# Patient Record
Sex: Male | Born: 1970 | Race: Black or African American | Hispanic: No | Marital: Single | Smoking: Current every day smoker
Health system: Southern US, Community
[De-identification: ages and names within clinical notes are randomized; demographics above are authoritative.]

## PROBLEM LIST (undated history)

## (undated) DIAGNOSIS — I10 Essential (primary) hypertension: Secondary | ICD-10-CM

## (undated) HISTORY — PX: ABDOMINAL EXPLORATION SURGERY: SHX538

## (undated) HISTORY — PX: ABDOMINAL SURGERY: SHX537

---

## 1999-04-20 ENCOUNTER — Inpatient Hospital Stay (HOSPITAL_COMMUNITY): Admission: EM | Admit: 1999-04-20 | Discharge: 1999-04-22 | Payer: Self-pay | Admitting: Emergency Medicine

## 1999-04-20 ENCOUNTER — Encounter: Payer: Self-pay | Admitting: Emergency Medicine

## 1999-06-01 ENCOUNTER — Emergency Department (HOSPITAL_COMMUNITY): Admission: EM | Admit: 1999-06-01 | Discharge: 1999-06-01 | Payer: Self-pay | Admitting: Emergency Medicine

## 1999-06-02 ENCOUNTER — Encounter: Payer: Self-pay | Admitting: Emergency Medicine

## 2005-11-28 ENCOUNTER — Emergency Department: Payer: Self-pay | Admitting: Emergency Medicine

## 2006-03-09 ENCOUNTER — Emergency Department: Payer: Self-pay | Admitting: Emergency Medicine

## 2006-10-09 ENCOUNTER — Emergency Department (HOSPITAL_COMMUNITY): Admission: EM | Admit: 2006-10-09 | Discharge: 2006-10-09 | Payer: Self-pay | Admitting: Emergency Medicine

## 2007-08-27 ENCOUNTER — Emergency Department (HOSPITAL_COMMUNITY): Admission: EM | Admit: 2007-08-27 | Discharge: 2007-08-27 | Payer: Self-pay | Admitting: Emergency Medicine

## 2008-10-21 ENCOUNTER — Emergency Department (HOSPITAL_COMMUNITY): Admission: EM | Admit: 2008-10-21 | Discharge: 2008-10-21 | Payer: Self-pay | Admitting: Emergency Medicine

## 2011-08-12 LAB — CSF CELL COUNT WITH DIFFERENTIAL
Lymphs, CSF: 0 % — ABNORMAL LOW (ref 40–80)
Monocyte-Macrophage-Spinal Fluid: 0 % — ABNORMAL LOW (ref 15–45)
RBC Count, CSF: 0 /mm3
Tube #: 4
WBC, CSF: 2 /mm3 (ref 0–5)

## 2011-08-12 LAB — PROTEIN, CSF: Total  Protein, CSF: 36 mg/dL (ref 15–45)

## 2011-08-12 LAB — CSF CULTURE W GRAM STAIN: Culture: NO GROWTH

## 2011-08-12 LAB — GLUCOSE, CSF: Glucose, CSF: 70 mg/dL (ref 43–76)

## 2011-08-17 LAB — URINE CULTURE
Colony Count: NO GROWTH
Culture: NO GROWTH

## 2011-08-17 LAB — URINALYSIS, ROUTINE W REFLEX MICROSCOPIC
Ketones, ur: NEGATIVE
Leukocytes, UA: NEGATIVE
Protein, ur: NEGATIVE
Urobilinogen, UA: 0.2

## 2011-08-17 LAB — URINE MICROSCOPIC-ADD ON

## 2011-11-19 ENCOUNTER — Encounter (HOSPITAL_COMMUNITY): Payer: Self-pay | Admitting: Urgent Care

## 2011-11-19 ENCOUNTER — Emergency Department (HOSPITAL_COMMUNITY)
Admission: EM | Admit: 2011-11-19 | Discharge: 2011-11-19 | Disposition: A | Discharge status: Home / Self Care | Payer: Self-pay | Source: Home / Self Care | Attending: Family Medicine | Admitting: Family Medicine

## 2011-11-19 DIAGNOSIS — J1189 Influenza due to unidentified influenza virus with other manifestations: Secondary | ICD-10-CM

## 2011-11-19 DIAGNOSIS — J112 Influenza due to unidentified influenza virus with gastrointestinal manifestations: Secondary | ICD-10-CM

## 2011-11-19 MED ORDER — ONDANSETRON HCL 4 MG/2ML IJ SOLN
4.0000 mg | Freq: Once | INTRAMUSCULAR | Status: AC
  Administered 2011-11-19: 4 mg via INTRAVENOUS

## 2011-11-19 MED ORDER — ONDANSETRON HCL 4 MG PO TABS: 4.0000 mg | ORAL_TABLET | Freq: Four times a day (QID) | ORAL | Status: AC

## 2011-11-19 MED ORDER — ONDANSETRON HCL 4 MG/2ML IJ SOLN
INTRAMUSCULAR | Status: AC
Start: 2011-11-19 — End: 2011-11-19
  Filled 2011-11-19: qty 2

## 2011-11-19 MED ORDER — SODIUM CHLORIDE 0.9 % IV BOLUS (SEPSIS)
1000.0000 mL | Freq: Once | INTRAVENOUS | Status: AC
  Administered 2011-11-19: 1000 mL via INTRAVENOUS
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
## 2011-11-19 NOTE — ED Notes (Signed)
Vomiting today, onset yesterday evening.  Minimal diarrhea.  Epigastric pain is aching.

## 2011-11-19 NOTE — ED Notes (Signed)
Dr Artis Flock notified of vital signs and recheck of vital signs post ivf infusion.

## 2011-11-19 NOTE — ED Notes (Signed)
Patient reports feeling a little better.

## 2011-11-19 NOTE — ED Notes (Signed)
Provided, pillow, blankets, repositioned exam table and dimmed lights.

## 2011-11-19 NOTE — ED Notes (Signed)
500 cc of fluid has infused

## 2011-11-26 ENCOUNTER — Encounter (HOSPITAL_COMMUNITY): Payer: Self-pay | Admitting: Emergency Medicine

## 2013-05-31 ENCOUNTER — Emergency Department (HOSPITAL_COMMUNITY): Payer: No Typology Code available for payment source

## 2013-05-31 ENCOUNTER — Encounter (HOSPITAL_COMMUNITY): Payer: Self-pay | Admitting: Adult Health

## 2013-05-31 DIAGNOSIS — F17200 Nicotine dependence, unspecified, uncomplicated: Secondary | ICD-10-CM | POA: Insufficient documentation

## 2013-05-31 DIAGNOSIS — S0993XA Unspecified injury of face, initial encounter: Secondary | ICD-10-CM | POA: Insufficient documentation

## 2013-05-31 DIAGNOSIS — S4980XA Other specified injuries of shoulder and upper arm, unspecified arm, initial encounter: Secondary | ICD-10-CM | POA: Insufficient documentation

## 2013-05-31 DIAGNOSIS — S46909A Unspecified injury of unspecified muscle, fascia and tendon at shoulder and upper arm level, unspecified arm, initial encounter: Secondary | ICD-10-CM | POA: Insufficient documentation

## 2013-05-31 DIAGNOSIS — Y9389 Activity, other specified: Secondary | ICD-10-CM | POA: Insufficient documentation

## 2013-05-31 DIAGNOSIS — Y92410 Unspecified street and highway as the place of occurrence of the external cause: Secondary | ICD-10-CM | POA: Insufficient documentation

## 2013-05-31 DIAGNOSIS — J45909 Unspecified asthma, uncomplicated: Secondary | ICD-10-CM | POA: Insufficient documentation

## 2013-05-31 DIAGNOSIS — T07XXXA Unspecified multiple injuries, initial encounter: Secondary | ICD-10-CM | POA: Insufficient documentation
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
## 2013-06-01 ENCOUNTER — Emergency Department (HOSPITAL_COMMUNITY)
Admission: EM | Admit: 2013-06-01 | Discharge: 2013-06-01 | Disposition: A | Discharge status: Home / Self Care | Payer: No Typology Code available for payment source | Attending: Emergency Medicine | Admitting: Emergency Medicine

## 2013-06-01 ENCOUNTER — Emergency Department (HOSPITAL_COMMUNITY): Payer: No Typology Code available for payment source

## 2013-06-01 DIAGNOSIS — T07XXXA Unspecified multiple injuries, initial encounter: Secondary | ICD-10-CM

## 2013-06-01 MED ORDER — HYDROMORPHONE HCL PF 1 MG/ML IJ SOLN
1.0000 mg | Freq: Once | INTRAMUSCULAR | Status: AC
  Administered 2013-06-01: 1 mg via INTRAMUSCULAR
  Filled 2013-06-01: qty 1

## 2013-06-01 MED ORDER — IBUPROFEN 800 MG PO TABS
800.0000 mg | ORAL_TABLET | Freq: Once | ORAL | Status: AC
  Administered 2013-06-01: 800 mg via ORAL
  Filled 2013-06-01: qty 1

## 2013-06-01 MED ORDER — ONDANSETRON 4 MG PO TBDP
4.0000 mg | ORAL_TABLET | Freq: Once | ORAL | Status: AC
  Administered 2013-06-01: 4 mg via ORAL
  Filled 2013-06-01: qty 1

## 2013-06-01 MED ORDER — IBUPROFEN 800 MG PO TABS: 800.0000 mg | ORAL_TABLET | Freq: Three times a day (TID) | ORAL | Status: DC

## 2013-06-01 MED ORDER — HYDROCODONE-ACETAMINOPHEN 5-325 MG PO TABS: 2.0000 | ORAL_TABLET | ORAL | Status: DC | PRN
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
## 2014-04-15 ENCOUNTER — Encounter (HOSPITAL_COMMUNITY): Payer: Self-pay | Admitting: Emergency Medicine

## 2014-04-15 ENCOUNTER — Emergency Department (HOSPITAL_COMMUNITY): Payer: No Typology Code available for payment source

## 2014-04-15 ENCOUNTER — Emergency Department (HOSPITAL_COMMUNITY)
Admission: EM | Admit: 2014-04-15 | Discharge: 2014-04-15 | Disposition: A | Discharge status: Home / Self Care | Payer: Self-pay | Attending: Emergency Medicine | Admitting: Emergency Medicine

## 2014-04-15 DIAGNOSIS — Z791 Long term (current) use of non-steroidal anti-inflammatories (NSAID): Secondary | ICD-10-CM | POA: Insufficient documentation

## 2014-04-15 DIAGNOSIS — I498 Other specified cardiac arrhythmias: Secondary | ICD-10-CM | POA: Insufficient documentation

## 2014-04-15 DIAGNOSIS — F17200 Nicotine dependence, unspecified, uncomplicated: Secondary | ICD-10-CM | POA: Insufficient documentation

## 2014-04-15 DIAGNOSIS — IMO0002 Reserved for concepts with insufficient information to code with codable children: Secondary | ICD-10-CM | POA: Insufficient documentation

## 2014-04-15 DIAGNOSIS — Y92410 Unspecified street and highway as the place of occurrence of the external cause: Secondary | ICD-10-CM | POA: Insufficient documentation

## 2014-04-15 DIAGNOSIS — S0993XA Unspecified injury of face, initial encounter: Secondary | ICD-10-CM | POA: Insufficient documentation

## 2014-04-15 DIAGNOSIS — S1981XA Other specified injuries of larynx, initial encounter: Secondary | ICD-10-CM

## 2014-04-15 DIAGNOSIS — S199XXA Unspecified injury of neck, initial encounter: Principal | ICD-10-CM

## 2014-04-15 DIAGNOSIS — J45909 Unspecified asthma, uncomplicated: Secondary | ICD-10-CM | POA: Insufficient documentation

## 2014-04-15 DIAGNOSIS — Y9389 Activity, other specified: Secondary | ICD-10-CM | POA: Insufficient documentation

## 2014-04-15 DIAGNOSIS — Z23 Encounter for immunization: Secondary | ICD-10-CM | POA: Insufficient documentation

## 2014-04-15 LAB — BASIC METABOLIC PANEL
BUN: 7 mg/dL (ref 6–23)
CALCIUM: 9.6 mg/dL (ref 8.4–10.5)
CO2: 24 meq/L (ref 19–32)
CREATININE: 1.04 mg/dL (ref 0.50–1.35)
Chloride: 107 mEq/L (ref 96–112)
GFR calc Af Amer: >90 mL/min (ref >90)
GFR calc non Af Amer: 85 mL/min — ABNORMAL LOW (ref >90)
GLUCOSE: 104 mg/dL — AB (ref 70–99)
Potassium: 3.6 mEq/L — ABNORMAL LOW (ref 3.7–5.3)
Sodium: 143 mEq/L (ref 137–147)

## 2014-04-15 LAB — CBC
HCT: 39.2 % (ref 39.0–52.0)
HEMOGLOBIN: 13.7 g/dL (ref 13.0–17.0)
MCH: 32.3 pg (ref 26.0–34.0)
MCHC: 34.9 g/dL (ref 30.0–36.0)
MCV: 92.5 fL (ref 78.0–100.0)
PLATELETS: 244 10*3/uL (ref 150–400)
RBC: 4.24 MIL/uL (ref 4.22–5.81)
RDW: 13.3 % (ref 11.5–15.5)
WBC: 11.4 10*3/uL — ABNORMAL HIGH (ref 4.0–10.5)

## 2014-04-15 LAB — I-STAT CHEM 8, ED
BUN: 5 mg/dL — AB (ref 6–23)
CREATININE: 1.1 mg/dL (ref 0.50–1.35)
Calcium, Ion: 1.27 mmol/L — ABNORMAL HIGH (ref 1.12–1.23)
Chloride: 104 mEq/L (ref 96–112)
Glucose, Bld: 106 mg/dL — ABNORMAL HIGH (ref 70–99)
HCT: 46 % (ref 39.0–52.0)
Hemoglobin: 15.6 g/dL (ref 13.0–17.0)
Potassium: 3.5 mEq/L — ABNORMAL LOW (ref 3.7–5.3)
SODIUM: 144 meq/L (ref 137–147)
TCO2: 23 mmol/L (ref 0–100)

## 2014-04-15 MED ORDER — MORPHINE SULFATE 4 MG/ML IJ SOLN
4.0000 mg | Freq: Once | INTRAMUSCULAR | Status: AC
  Administered 2014-04-15: 4 mg via INTRAVENOUS
  Filled 2014-04-15: qty 1

## 2014-04-15 MED ORDER — SODIUM CHLORIDE 0.9 % IV BOLUS (SEPSIS)
1000.0000 mL | Freq: Once | INTRAVENOUS | Status: AC
  Administered 2014-04-15: 1000 mL via INTRAVENOUS

## 2014-04-15 MED ORDER — IOHEXOL 350 MG/ML SOLN
50.0000 mL | Freq: Once | INTRAVENOUS | Status: AC | PRN
  Administered 2014-04-15: 50 mL via INTRAVENOUS

## 2014-04-15 MED ORDER — IBUPROFEN 800 MG PO TABS: 800.0000 mg | ORAL_TABLET | Freq: Three times a day (TID) | ORAL | Status: DC

## 2014-04-15 MED ORDER — TETANUS-DIPHTH-ACELL PERTUSSIS 5-2.5-18.5 LF-MCG/0.5 IM SUSP
0.5000 mL | Freq: Once | INTRAMUSCULAR | Status: AC
  Administered 2014-04-15: 0.5 mL via INTRAMUSCULAR
  Filled 2014-04-15: qty 0.5

## 2014-04-15 MED ORDER — OXYCODONE-ACETAMINOPHEN 5-325 MG PO TABS: 1.0000 | ORAL_TABLET | ORAL | Status: DC | PRN
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
## 2014-04-15 NOTE — Discharge Instructions (Signed)
Call for a follow up appointment with a Family or Primary Care Provider.  Call Dr. Jearld Fenton or another otolaryngologist (ear nose throat specialist) for further evaluation of your neck injury. Return to the Emergency Department if symptoms worsen.   Take medication as prescribed.  Ice your neck 3-4 times a day as needed. Do not work or operate heavy machinery, drive while taking the Percocet.
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
## 2014-04-15 NOTE — ED Notes (Signed)
CT called and advised of IV placement
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
## 2015-03-19 ENCOUNTER — Emergency Department (HOSPITAL_COMMUNITY): Payer: Self-pay

## 2015-03-19 ENCOUNTER — Emergency Department (HOSPITAL_COMMUNITY)
Admission: EM | Admit: 2015-03-19 | Discharge: 2015-03-19 | Disposition: A | Discharge status: Home / Self Care | Payer: Self-pay | Attending: Emergency Medicine | Admitting: Emergency Medicine

## 2015-03-19 DIAGNOSIS — J45901 Unspecified asthma with (acute) exacerbation: Secondary | ICD-10-CM | POA: Insufficient documentation

## 2015-03-19 DIAGNOSIS — Z720 Tobacco use: Secondary | ICD-10-CM | POA: Insufficient documentation

## 2015-03-19 DIAGNOSIS — R112 Nausea with vomiting, unspecified: Secondary | ICD-10-CM | POA: Insufficient documentation

## 2015-03-19 DIAGNOSIS — R42 Dizziness and giddiness: Secondary | ICD-10-CM | POA: Insufficient documentation

## 2015-03-19 DIAGNOSIS — R109 Unspecified abdominal pain: Secondary | ICD-10-CM | POA: Insufficient documentation

## 2015-03-19 DIAGNOSIS — R079 Chest pain, unspecified: Secondary | ICD-10-CM | POA: Insufficient documentation

## 2015-03-19 LAB — I-STAT TROPONIN, ED: Troponin i, poc: 0.01 ng/mL (ref 0.00–0.08)

## 2015-03-19 LAB — CBC
HCT: 43.9 % (ref 39.0–52.0)
Hemoglobin: 15.4 g/dL (ref 13.0–17.0)
MCH: 31.8 pg (ref 26.0–34.0)
MCHC: 35.1 g/dL (ref 30.0–36.0)
MCV: 90.5 fL (ref 78.0–100.0)
Platelets: 294 10*3/uL (ref 150–400)
RBC: 4.85 MIL/uL (ref 4.22–5.81)
RDW: 13.9 % (ref 11.5–15.5)
WBC: 14.3 10*3/uL — ABNORMAL HIGH (ref 4.0–10.5)

## 2015-03-19 LAB — BASIC METABOLIC PANEL
Anion gap: 15 (ref 5–15)
BUN: 6 mg/dL (ref 6–20)
CO2: 24 mmol/L (ref 22–32)
Calcium: 9.7 mg/dL (ref 8.9–10.3)
Chloride: 100 mmol/L — ABNORMAL LOW (ref 101–111)
Creatinine, Ser: 1.05 mg/dL (ref 0.61–1.24)
GFR calc Af Amer: >60 mL/min (ref >60)
GFR calc non Af Amer: >60 mL/min (ref >60)
Glucose, Bld: 154 mg/dL — ABNORMAL HIGH (ref 65–99)
Potassium: 3.6 mmol/L (ref 3.5–5.1)
Sodium: 139 mmol/L (ref 135–145)

## 2015-03-19 LAB — BRAIN NATRIURETIC PEPTIDE: B Natriuretic Peptide: 191.4 pg/mL — ABNORMAL HIGH (ref 0.0–100.0)

## 2015-03-19 MED ORDER — ONDANSETRON HCL 4 MG PO TABS: 4.0000 mg | ORAL_TABLET | Freq: Four times a day (QID) | ORAL | Status: DC

## 2015-03-19 MED ORDER — SODIUM CHLORIDE 0.9 % IV BOLUS (SEPSIS)
1000.0000 mL | Freq: Once | INTRAVENOUS | Status: AC
  Administered 2015-03-19: 1000 mL via INTRAVENOUS

## 2015-03-19 MED ORDER — HYDROMORPHONE HCL 1 MG/ML IJ SOLN
1.0000 mg | Freq: Once | INTRAMUSCULAR | Status: AC
  Administered 2015-03-19: 1 mg via INTRAVENOUS
  Filled 2015-03-19: qty 1

## 2015-03-19 MED ORDER — AMLODIPINE BESYLATE 10 MG PO TABS: 10.0000 mg | ORAL_TABLET | Freq: Every day | ORAL | Status: DC

## 2015-03-19 MED ORDER — ONDANSETRON HCL 4 MG/2ML IJ SOLN
4.0000 mg | Freq: Once | INTRAMUSCULAR | Status: AC
Start: 2015-03-19 — End: 2015-03-19
  Administered 2015-03-19: 4 mg via INTRAVENOUS
  Filled 2015-03-19: qty 2

## 2015-03-19 NOTE — ED Notes (Signed)
Pt in xray, spoke with radiology. They will bring pt to E38 when finished with xray
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
## 2015-03-19 NOTE — ED Notes (Signed)
Pt's HR in the 40s. Pt has hx of bradycardia and has been in the 40s in the last 3 visits dating back to 2014

## 2015-03-19 NOTE — ED Notes (Signed)
Pt reports n/v and left sided chest pain for two days. Pt reports shortness of breath, dizziness, lightheadedness, n/v and weakness with the chest pain.

## 2015-03-19 NOTE — Discharge Instructions (Signed)
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
## 2016-06-27 ENCOUNTER — Encounter (HOSPITAL_COMMUNITY): Payer: Self-pay | Admitting: Emergency Medicine

## 2016-06-27 ENCOUNTER — Emergency Department (HOSPITAL_COMMUNITY): Payer: Self-pay

## 2016-06-27 ENCOUNTER — Emergency Department (HOSPITAL_COMMUNITY)
Admission: EM | Admit: 2016-06-27 | Discharge: 2016-06-27 | Disposition: A | Discharge status: Home / Self Care | Payer: Self-pay | Attending: Emergency Medicine | Admitting: Emergency Medicine

## 2016-06-27 DIAGNOSIS — R112 Nausea with vomiting, unspecified: Secondary | ICD-10-CM | POA: Insufficient documentation

## 2016-06-27 DIAGNOSIS — F17210 Nicotine dependence, cigarettes, uncomplicated: Secondary | ICD-10-CM | POA: Insufficient documentation

## 2016-06-27 DIAGNOSIS — Z791 Long term (current) use of non-steroidal anti-inflammatories (NSAID): Secondary | ICD-10-CM | POA: Insufficient documentation

## 2016-06-27 DIAGNOSIS — R1084 Generalized abdominal pain: Secondary | ICD-10-CM | POA: Insufficient documentation

## 2016-06-27 DIAGNOSIS — J45909 Unspecified asthma, uncomplicated: Secondary | ICD-10-CM | POA: Insufficient documentation

## 2016-06-27 LAB — CBC
HEMATOCRIT: 42.1 % (ref 39.0–52.0)
Hemoglobin: 14.7 g/dL (ref 13.0–17.0)
MCH: 31.7 pg (ref 26.0–34.0)
MCHC: 34.9 g/dL (ref 30.0–36.0)
MCV: 90.7 fL (ref 78.0–100.0)
Platelets: 277 10*3/uL (ref 150–400)
RBC: 4.64 MIL/uL (ref 4.22–5.81)
RDW: 14.4 % (ref 11.5–15.5)
WBC: 14.1 10*3/uL — AB (ref 4.0–10.5)

## 2016-06-27 LAB — COMPREHENSIVE METABOLIC PANEL
ALBUMIN: 4.5 g/dL (ref 3.5–5.0)
ALT: 20 U/L (ref 17–63)
AST: 22 U/L (ref 15–41)
Alkaline Phosphatase: 75 U/L (ref 38–126)
Anion gap: 9 (ref 5–15)
BUN: 8 mg/dL (ref 6–20)
CO2: 25 mmol/L (ref 22–32)
Calcium: 9.6 mg/dL (ref 8.9–10.3)
Chloride: 106 mmol/L (ref 101–111)
Creatinine, Ser: 1 mg/dL (ref 0.61–1.24)
GFR calc Af Amer: >60 mL/min (ref >60)
Glucose, Bld: 152 mg/dL — ABNORMAL HIGH (ref 65–99)
POTASSIUM: 3.5 mmol/L (ref 3.5–5.1)
Sodium: 140 mmol/L (ref 135–145)
Total Bilirubin: 0.5 mg/dL (ref 0.3–1.2)
Total Protein: 8.4 g/dL — ABNORMAL HIGH (ref 6.5–8.1)

## 2016-06-27 LAB — URINALYSIS, ROUTINE W REFLEX MICROSCOPIC
Bilirubin Urine: NEGATIVE
GLUCOSE, UA: NEGATIVE mg/dL
KETONES UR: 15 mg/dL — AB
LEUKOCYTES UA: NEGATIVE
Nitrite: NEGATIVE
Protein, ur: 30 mg/dL — AB
Specific Gravity, Urine: 1.025 (ref 1.005–1.030)
pH: 7.5 (ref 5.0–8.0)

## 2016-06-27 LAB — URINE MICROSCOPIC-ADD ON
BACTERIA UA: NONE SEEN
Squamous Epithelial / LPF: NONE SEEN
WBC, UA: NONE SEEN WBC/hpf (ref 0–5)

## 2016-06-27 LAB — LIPASE, BLOOD: LIPASE: 19 U/L (ref 11–51)

## 2016-06-27 MED ORDER — SODIUM CHLORIDE 0.9 % IV BOLUS (SEPSIS)
1000.0000 mL | Freq: Once | INTRAVENOUS | Status: AC
  Administered 2016-06-27: 1000 mL via INTRAVENOUS

## 2016-06-27 MED ORDER — ONDANSETRON 8 MG PO TBDP: 8.0000 mg | ORAL_TABLET | Freq: Three times a day (TID) | ORAL | 0 refills | Status: DC | PRN

## 2016-06-27 MED ORDER — FENTANYL CITRATE (PF) 100 MCG/2ML IJ SOLN
100.0000 ug | Freq: Once | INTRAMUSCULAR | Status: AC
  Administered 2016-06-27: 100 ug via INTRAVENOUS
  Filled 2016-06-27: qty 2

## 2016-06-27 MED ORDER — ONDANSETRON HCL 4 MG/2ML IJ SOLN
4.0000 mg | Freq: Once | INTRAMUSCULAR | Status: AC
  Administered 2016-06-27: 4 mg via INTRAVENOUS
  Filled 2016-06-27: qty 2

## 2016-06-27 NOTE — ED Notes (Signed)
Pt stated that he would try to urinate. Will check again in 10 minutes.
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
## 2016-06-27 NOTE — ED Triage Notes (Signed)
Patient presents for generalized abdominal pain, N/V x2-3 days. Reports 10+ episodes of emesis in last 24 hours. Denies fever, diarrhea or urinary symptoms. A&O x4.

## 2016-06-27 NOTE — Progress Notes (Signed)
EDCM spoke to patient at bedside. Patient confirms he does not have a pcp or insurance living in [place].  EDCM provided patient with contact information to [facility], informed patient of services there and walk in times.  EDCM also provided patient with list of pcps who accept self pay patients, list of discount pharmacies and websites needymeds.org and GoodRX.com for medication assistance, phone number to inquire about the orange card, phone number to inquire about Medicaid, phone number to inquire about the Affordable Care Act, financial resources in the community such as local churches, [employer], urban ministries, and dental assistance for uninsured patients.  Patient thankful for resources.  No further EDCM needs at this time. 

## 2016-06-27 NOTE — ED Notes (Signed)
Pt in xray

## 2016-06-28 ENCOUNTER — Telehealth: Payer: Self-pay | Admitting: *Deleted

## 2016-09-19 ENCOUNTER — Emergency Department (HOSPITAL_COMMUNITY): Payer: No Typology Code available for payment source

## 2016-09-19 ENCOUNTER — Emergency Department (HOSPITAL_COMMUNITY)
Admission: EM | Admit: 2016-09-19 | Discharge: 2016-09-19 | Disposition: A | Discharge status: Home / Self Care | Payer: No Typology Code available for payment source | Attending: Emergency Medicine | Admitting: Emergency Medicine

## 2016-09-19 ENCOUNTER — Encounter (HOSPITAL_COMMUNITY): Payer: Self-pay | Admitting: *Deleted

## 2016-09-19 DIAGNOSIS — S3992XA Unspecified injury of lower back, initial encounter: Secondary | ICD-10-CM | POA: Diagnosis present

## 2016-09-19 DIAGNOSIS — Y999 Unspecified external cause status: Secondary | ICD-10-CM | POA: Diagnosis not present

## 2016-09-19 DIAGNOSIS — S9031XA Contusion of right foot, initial encounter: Secondary | ICD-10-CM | POA: Insufficient documentation

## 2016-09-19 DIAGNOSIS — S8011XA Contusion of right lower leg, initial encounter: Secondary | ICD-10-CM | POA: Diagnosis not present

## 2016-09-19 DIAGNOSIS — S8001XA Contusion of right knee, initial encounter: Secondary | ICD-10-CM

## 2016-09-19 DIAGNOSIS — S39012A Strain of muscle, fascia and tendon of lower back, initial encounter: Secondary | ICD-10-CM | POA: Insufficient documentation

## 2016-09-19 DIAGNOSIS — S9001XA Contusion of right ankle, initial encounter: Secondary | ICD-10-CM | POA: Diagnosis not present

## 2016-09-19 DIAGNOSIS — J45909 Unspecified asthma, uncomplicated: Secondary | ICD-10-CM | POA: Insufficient documentation

## 2016-09-19 DIAGNOSIS — Y939 Activity, unspecified: Secondary | ICD-10-CM | POA: Diagnosis not present

## 2016-09-19 DIAGNOSIS — S80211A Abrasion, right knee, initial encounter: Secondary | ICD-10-CM | POA: Insufficient documentation

## 2016-09-19 DIAGNOSIS — F17210 Nicotine dependence, cigarettes, uncomplicated: Secondary | ICD-10-CM | POA: Diagnosis not present

## 2016-09-19 DIAGNOSIS — S60212A Contusion of left wrist, initial encounter: Secondary | ICD-10-CM | POA: Diagnosis not present

## 2016-09-19 DIAGNOSIS — Y92410 Unspecified street and highway as the place of occurrence of the external cause: Secondary | ICD-10-CM | POA: Diagnosis not present

## 2016-09-19 MED ORDER — CYCLOBENZAPRINE HCL 10 MG PO TABS: 10.0000 mg | ORAL_TABLET | Freq: Two times a day (BID) | ORAL | 0 refills | Status: DC | PRN

## 2016-09-19 MED ORDER — HYDROCODONE-ACETAMINOPHEN 5-325 MG PO TABS: 1.0000 | ORAL_TABLET | ORAL | 0 refills | Status: DC | PRN

## 2016-09-19 MED ORDER — CYCLOBENZAPRINE HCL 10 MG PO TABS
10.0000 mg | ORAL_TABLET | Freq: Once | ORAL | Status: AC
  Administered 2016-09-19: 10 mg via ORAL
  Filled 2016-09-19: qty 1

## 2016-09-19 MED ORDER — HYDROCODONE-ACETAMINOPHEN 5-325 MG PO TABS
1.0000 | ORAL_TABLET | Freq: Once | ORAL | Status: AC
  Administered 2016-09-19: 1 via ORAL
  Filled 2016-09-19: qty 1

## 2016-09-19 NOTE — ED Notes (Signed)
Pt is in stable condition upon d/c and ambulates from ED. 
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
## 2017-01-23 ENCOUNTER — Encounter (HOSPITAL_COMMUNITY): Payer: Self-pay | Admitting: Emergency Medicine

## 2017-01-23 DIAGNOSIS — F17210 Nicotine dependence, cigarettes, uncomplicated: Secondary | ICD-10-CM | POA: Insufficient documentation

## 2017-01-23 DIAGNOSIS — R1110 Vomiting, unspecified: Secondary | ICD-10-CM | POA: Insufficient documentation

## 2017-01-23 DIAGNOSIS — Z5321 Procedure and treatment not carried out due to patient leaving prior to being seen by health care provider: Secondary | ICD-10-CM | POA: Insufficient documentation

## 2017-01-23 DIAGNOSIS — Z79899 Other long term (current) drug therapy: Secondary | ICD-10-CM | POA: Insufficient documentation

## 2017-01-23 LAB — CBC
HCT: 42.2 % (ref 39.0–52.0)
HEMOGLOBIN: 14.6 g/dL (ref 13.0–17.0)
MCH: 31.3 pg (ref 26.0–34.0)
MCHC: 34.6 g/dL (ref 30.0–36.0)
MCV: 90.6 fL (ref 78.0–100.0)
PLATELETS: 286 10*3/uL (ref 150–400)
RBC: 4.66 MIL/uL (ref 4.22–5.81)
RDW: 14.6 % (ref 11.5–15.5)
WBC: 14.7 10*3/uL — ABNORMAL HIGH (ref 4.0–10.5)

## 2017-01-23 LAB — COMPREHENSIVE METABOLIC PANEL
ALBUMIN: 4.2 g/dL (ref 3.5–5.0)
ALK PHOS: 84 U/L (ref 38–126)
ALT: 21 U/L (ref 17–63)
ANION GAP: 10 (ref 5–15)
AST: 22 U/L (ref 15–41)
BUN: 6 mg/dL (ref 6–20)
CO2: 26 mmol/L (ref 22–32)
Calcium: 9.6 mg/dL (ref 8.9–10.3)
Chloride: 103 mmol/L (ref 101–111)
Creatinine, Ser: 0.99 mg/dL (ref 0.61–1.24)
GFR calc Af Amer: >60 mL/min (ref >60)
GFR calc non Af Amer: >60 mL/min (ref >60)
GLUCOSE: 149 mg/dL — AB (ref 65–99)
POTASSIUM: 3 mmol/L — AB (ref 3.5–5.1)
SODIUM: 139 mmol/L (ref 135–145)
Total Bilirubin: 0.4 mg/dL (ref 0.3–1.2)
Total Protein: 8.1 g/dL (ref 6.5–8.1)

## 2017-01-23 LAB — LIPASE, BLOOD

## 2017-01-23 MED ORDER — ONDANSETRON 4 MG PO TBDP
ORAL_TABLET | ORAL | Status: AC
  Administered 2017-01-23: 4 mg via ORAL
  Filled 2017-01-23: qty 1

## 2017-01-23 MED ORDER — ONDANSETRON 4 MG PO TBDP
4.0000 mg | ORAL_TABLET | Freq: Once | ORAL | Status: AC
  Administered 2017-01-23: 4 mg via ORAL

## 2017-01-23 NOTE — ED Triage Notes (Signed)
Pt sts N/V x 2 days with abd pain

## 2017-01-24 ENCOUNTER — Emergency Department (HOSPITAL_COMMUNITY)
Admission: EM | Admit: 2017-01-24 | Discharge: 2017-01-24 | Disposition: A | Discharge status: Home / Self Care | Payer: Self-pay | Attending: Dermatology | Admitting: Dermatology
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
## 2020-03-17 ENCOUNTER — Other Ambulatory Visit: Payer: Self-pay

## 2020-03-17 ENCOUNTER — Encounter (HOSPITAL_COMMUNITY): Payer: Self-pay

## 2020-03-17 ENCOUNTER — Ambulatory Visit (HOSPITAL_COMMUNITY)
Admission: EM | Admit: 2020-03-17 | Discharge: 2020-03-17 | Disposition: A | Discharge status: Home / Self Care | Payer: Self-pay | Attending: Family Medicine | Admitting: Family Medicine

## 2020-03-17 DIAGNOSIS — M722 Plantar fascial fibromatosis: Secondary | ICD-10-CM

## 2020-03-17 DIAGNOSIS — L84 Corns and callosities: Secondary | ICD-10-CM

## 2020-03-17 MED ORDER — NAPROXEN 500 MG PO TABS: 500.0000 mg | ORAL_TABLET | Freq: Two times a day (BID) | ORAL | 0 refills | Status: DC

## 2020-03-17 MED ORDER — PREDNISONE 10 MG PO TABS: ORAL_TABLET | ORAL | 0 refills | Status: DC

## 2020-03-17 NOTE — Discharge Instructions (Addendum)
Rest Ice Roll heel on golf ball in am Please be sure to wear shoes that fit and are not worn out Please try stretching calf, foot ankle circles, toe curls Prednisone course over next 6 days-begin with 6 tablets on day 1, decrease by 1 tablet each day until complete, take with food and in the morning if you are able After completion of prednisone may use Naprosyn twice daily with food  Soak foot to help soften callous  If persisting follow up with [facility] [address] [place], [place], [place] [address] [phone]

## 2020-03-17 NOTE — ED Triage Notes (Signed)
C/o bilateral foot pain x2 weeks. Reports pain is in the heels and is beginning to work its way up into his legs.

## 2020-03-18 ENCOUNTER — Ambulatory Visit (INDEPENDENT_AMBULATORY_CARE_PROVIDER_SITE_OTHER): Payer: No Typology Code available for payment source

## 2020-03-18 ENCOUNTER — Ambulatory Visit (INDEPENDENT_AMBULATORY_CARE_PROVIDER_SITE_OTHER): Payer: No Typology Code available for payment source | Admitting: Podiatrist

## 2020-03-18 ENCOUNTER — Encounter: Payer: Self-pay | Admitting: Podiatrist

## 2020-03-18 DIAGNOSIS — M79672 Pain in left foot: Secondary | ICD-10-CM

## 2020-03-18 DIAGNOSIS — M722 Plantar fascial fibromatosis: Secondary | ICD-10-CM

## 2020-03-18 DIAGNOSIS — L84 Corns and callosities: Secondary | ICD-10-CM

## 2020-03-18 DIAGNOSIS — M79671 Pain in right foot: Secondary | ICD-10-CM

## 2020-03-18 DIAGNOSIS — M19079 Primary osteoarthritis, unspecified ankle and foot: Secondary | ICD-10-CM

## 2020-03-18 DIAGNOSIS — M216X1 Other acquired deformities of right foot: Secondary | ICD-10-CM

## 2020-03-18 NOTE — Patient Instructions (Signed)

## 2020-03-18 NOTE — Progress Notes (Signed)
    Chief Complaint  Patient presents with  . Foot Pain    Bilateral plantar heel pain 2 week duration no known injuries.  . Foot Problem    Right sub 5th painful lesion 3 months duration.     HPI: Patient is 49 y.o. male who presents today for the concerns as listed above.  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  He also states he has been walking funny and now has heel pain regular than left.   Review of Systems No fevers, chills, nausea, muscle aches, no difficulty breathing, no calf pain, no chest pain or shortness of breath.   Physical Exam  GENERAL APPEARANCE: Alert, conversant. Appropriately groomed. No acute distress.   VASCULAR: Pedal pulses palpable DP and PT bilateral.  Capillary refill time is immediate to all digits,  Proximal to distal cooling it warm to warm.  Digital hair growth is present bilateral   NEUROLOGIC: sensation is intact epicritically and protectively to 5.07 monofilament at 5/5 sites bilateral.  Light touch is intact bilateral, vibratory sensation intact bilateral, achilles tendon reflex is intact bilateral.   MUSCULOSKELETAL: acceptable muscle strength, tone and stability bilateral.  No gross boney pedal deformities noted.  Some pain, crepitus or limitation noted with ankle range of motion right more symptomatic than the left.  Pain on palpation plantar medial aspect of the plantar fascial insertion right greater than left is noted.  DERMATOLOGIC: skin is warm, supple, and dry.  Hyperkeratotic lesions submetatarsal 5 of the right foot is noted to discrete porokeratotic areas of waxy core are noted.  Pain with direct pressure is noted.  X-ray evaluation 3 views of bilateral feet are obtained.  Lipping at the dorsal aspect of the first metatarsal head is noted bilateral.  Anterior talar spurring and  posterior spurring is present on the left.  Anterior talar spur and tibial spur is also noted causing a tibiotalar impingement on the right.  Mild bunion deformity noted bilateral.   Assessment    ICD-10-CM   1. Pain in left foot  M79.672 DG Foot Complete Left  2. Pain in right foot  M79.671 DG Foot Complete Right  3. Plantar fasciitis, bilateral  M72.2   4. Osteoarthritis of ankle or foot, unspecified laterality  M19.079   5. Plantar callus  L84   6. Prominent metatarsal head of right foot  M21.6X1      Plan  Debrided the callus with a 15 blade to a safe level without complication.  Injected the plantar fascial on the right with Kenalog and Marcaine mixture under sterile technique without complication.  Recommended orthotics to offload the submetatarsal 5 region of the right foot.  A removable pad was also created today to offload the area as well.  He will be seen back for orthotic casting.
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
## 2020-03-19 ENCOUNTER — Other Ambulatory Visit: Payer: Self-pay | Admitting: Podiatrist

## 2020-03-19 DIAGNOSIS — M722 Plantar fascial fibromatosis: Secondary | ICD-10-CM

## 2020-04-03 ENCOUNTER — Other Ambulatory Visit: Payer: No Typology Code available for payment source | Admitting: Orthotics

## 2020-06-15 ENCOUNTER — Ambulatory Visit (INDEPENDENT_AMBULATORY_CARE_PROVIDER_SITE_OTHER): Payer: No Typology Code available for payment source | Admitting: Podiatry

## 2020-06-15 ENCOUNTER — Other Ambulatory Visit: Payer: Self-pay

## 2020-06-15 DIAGNOSIS — M2142 Flat foot [pes planus] (acquired), left foot: Secondary | ICD-10-CM

## 2020-06-15 DIAGNOSIS — M216X1 Other acquired deformities of right foot: Secondary | ICD-10-CM | POA: Diagnosis not present

## 2020-06-15 DIAGNOSIS — M2141 Flat foot [pes planus] (acquired), right foot: Secondary | ICD-10-CM | POA: Diagnosis not present

## 2020-06-15 DIAGNOSIS — M79671 Pain in right foot: Secondary | ICD-10-CM | POA: Diagnosis not present

## 2020-06-15 DIAGNOSIS — M79672 Pain in left foot: Secondary | ICD-10-CM | POA: Diagnosis not present

## 2020-06-15 DIAGNOSIS — M722 Plantar fascial fibromatosis: Secondary | ICD-10-CM

## 2020-06-15 DIAGNOSIS — M216X2 Other acquired deformities of left foot: Secondary | ICD-10-CM | POA: Diagnosis not present

## 2020-06-15 DIAGNOSIS — M21861 Other specified acquired deformities of right lower leg: Secondary | ICD-10-CM

## 2020-06-15 DIAGNOSIS — M21862 Other specified acquired deformities of left lower leg: Secondary | ICD-10-CM

## 2020-06-15 MED ORDER — MELOXICAM 15 MG PO TABS: 15.0000 mg | ORAL_TABLET | Freq: Every day | ORAL | 0 refills | Status: DC

## 2020-06-15 NOTE — Progress Notes (Addendum)
  Subjective:  Patient ID: Douglas Harrison, male    DOB: [birth year],  MRN: [id number]  Chief Complaint  Patient presents with  . Follow-up    Bilateral plantar fasciitis and R plantar forefoot callus. Pt stated, "The plantar fasciitis is not better. R foot pain = 9/10, L =4-5/10. Nothing helps. I use a rolling massager on my feet every night. The callus isn't bothering me - I can put up with it. My heels are killing me".    49 y.o. male presents with the above complaint. History confirmed with patient.  Was last seen in May by Dr. Irving Harrison.  Had an injection on the right heel.  He had helped for about 2 weeks.  The worst is still on the right side.  He said he has not been doing much stretching.  Objective:  Physical Exam: warm, good capillary refill, no trophic changes or ulcerative lesions, normal DP and PT pulses and normal sensory exam. Left Foot: point tenderness over the heel pad , pes planus, gastrocnemius equinus is present Right Foot: point tenderness over the heel pad , pes planus and gastrocnemius equinus is present  Assessment:   1. Pain in both feet   2. Gastrocnemius equinus of left lower extremity   3. Gastrocnemius equinus of right lower extremity   4. Plantar fasciitis of left foot   5. Plantar fasciitis, right   6. Pes planus of both feet      Plan:  Patient was evaluated and treated and all questions answered.  Plantar Fasciitis -XR reviewed with patient -Educated patient on stretching and icing of the affected limb -Night splint dispensed -Plantar fascial brace dispensed -Injection delivered to the plantar fascia of both feet. -Rx for meloxicam. Educated on use, risks and benefits of the medication -If is not improved after next visit we will consider physical therapy -He also benefit from orthotics to help with his collapsing medial arches and pes planus.  He will check his insurance to see if this covered and schedule appointment to get casted for them  here.  -After sterile prep with povidone-iodine solution and alcohol, the bilateral heels wer injected with 0.5cc 2% xylocaine plain, 0.5cc 0.5% marcaine plain, 5mg  triamcinolone acetonide, and 2mg  dexamethasone was injected along the plantar fascia medially at the insertion on the plantar calcaneus. The patient tolerated the procedure well without complication.    Return in about 1 month (around 07/16/2020) for recheck plantar fasciitis.

## 2020-06-15 NOTE — Patient Instructions (Addendum)

## 2020-07-09 ENCOUNTER — Other Ambulatory Visit: Payer: Self-pay

## 2020-07-09 ENCOUNTER — Encounter (HOSPITAL_COMMUNITY): Payer: Self-pay

## 2020-07-09 ENCOUNTER — Ambulatory Visit (HOSPITAL_COMMUNITY)
Admission: EM | Admit: 2020-07-09 | Discharge: 2020-07-09 | Disposition: A | Discharge status: Home / Self Care | Payer: Self-pay | Attending: Physician Assistant | Admitting: Physician Assistant

## 2020-07-09 DIAGNOSIS — M722 Plantar fascial fibromatosis: Secondary | ICD-10-CM

## 2020-07-09 MED ORDER — MELOXICAM 15 MG PO TABS: 15.0000 mg | ORAL_TABLET | Freq: Every day | ORAL | 0 refills | Status: AC

## 2020-07-09 NOTE — Discharge Instructions (Signed)
Take the mobic with food as prescribed You may also take regular strength tylenol as needed every 6 hours  Roll, ice and rest feet   Follow up with podiatrist as scheduled
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
## 2020-07-09 NOTE — ED Triage Notes (Signed)
Patient presents with complaints of heel pain that has been worsening x 6 months. Denies taking any medication for pain at home. States he works on his feet and the pain is making it hard for him to work. Reports being here a month ago and being diagnosed with plantar fascitis. States the right heel hurts worse than the left. Denies any injury.

## 2020-07-16 ENCOUNTER — Ambulatory Visit: Payer: No Typology Code available for payment source | Admitting: Podiatry

## 2020-12-24 ENCOUNTER — Ambulatory Visit
Admission: RE | Admit: 2020-12-24 | Discharge: 2020-12-24 | Disposition: A | Discharge status: Home / Self Care | Payer: No Typology Code available for payment source | Source: Ambulatory Visit | Attending: Physician Assistant | Admitting: Physician Assistant

## 2020-12-24 ENCOUNTER — Other Ambulatory Visit: Payer: Self-pay | Admitting: Physician Assistant

## 2020-12-24 DIAGNOSIS — E539 Vitamin B deficiency, unspecified: Secondary | ICD-10-CM

## 2020-12-24 DIAGNOSIS — R52 Pain, unspecified: Secondary | ICD-10-CM

## 2021-01-13 ENCOUNTER — Encounter (HOSPITAL_COMMUNITY): Payer: Self-pay | Admitting: Emergency Medicine

## 2021-01-13 ENCOUNTER — Emergency Department (HOSPITAL_COMMUNITY)
Admission: EM | Admit: 2021-01-13 | Discharge: 2021-01-13 | Disposition: A | Discharge status: Home / Self Care | Payer: No Typology Code available for payment source | Attending: Emergency Medicine | Admitting: Emergency Medicine

## 2021-01-13 ENCOUNTER — Ambulatory Visit
Admission: EM | Admit: 2021-01-13 | Discharge: 2021-01-13 | Disposition: A | Discharge status: Home / Self Care | Payer: No Typology Code available for payment source

## 2021-01-13 ENCOUNTER — Other Ambulatory Visit: Payer: Self-pay

## 2021-01-13 DIAGNOSIS — I10 Essential (primary) hypertension: Secondary | ICD-10-CM | POA: Diagnosis not present

## 2021-01-13 DIAGNOSIS — F17210 Nicotine dependence, cigarettes, uncomplicated: Secondary | ICD-10-CM | POA: Insufficient documentation

## 2021-01-13 DIAGNOSIS — Z79899 Other long term (current) drug therapy: Secondary | ICD-10-CM | POA: Diagnosis not present

## 2021-01-13 DIAGNOSIS — J45909 Unspecified asthma, uncomplicated: Secondary | ICD-10-CM | POA: Diagnosis not present

## 2021-01-13 HISTORY — DX: Essential (primary) hypertension: I10

## 2021-01-13 LAB — COMPREHENSIVE METABOLIC PANEL
ALT: 15 U/L (ref 0–44)
AST: 19 U/L (ref 15–41)
Albumin: 3.8 g/dL (ref 3.5–5.0)
Alkaline Phosphatase: 71 U/L (ref 38–126)
Anion gap: 9 (ref 5–15)
BUN: 11 mg/dL (ref 6–20)
CO2: 23 mmol/L (ref 22–32)
Calcium: 8.8 mg/dL — ABNORMAL LOW (ref 8.9–10.3)
Chloride: 103 mmol/L (ref 98–111)
Creatinine, Ser: 1.11 mg/dL (ref 0.61–1.24)
GFR, Estimated: >60 mL/min (ref >60)
Glucose, Bld: 138 mg/dL — ABNORMAL HIGH (ref 70–99)
Potassium: 3.5 mmol/L (ref 3.5–5.1)
Sodium: 135 mmol/L (ref 135–145)
Total Bilirubin: 0.4 mg/dL (ref 0.3–1.2)
Total Protein: 7.2 g/dL (ref 6.5–8.1)

## 2021-01-13 LAB — CBC WITH DIFFERENTIAL/PLATELET
Abs Immature Granulocytes: 0.02 10*3/uL (ref 0.00–0.07)
Basophils Absolute: 0.1 10*3/uL (ref 0.0–0.1)
Basophils Relative: 1 %
Eosinophils Absolute: 0.5 10*3/uL (ref 0.0–0.5)
Eosinophils Relative: 5 %
HCT: 42.2 % (ref 39.0–52.0)
Hemoglobin: 14.2 g/dL (ref 13.0–17.0)
Immature Granulocytes: 0 %
Lymphocytes Relative: 29 %
Lymphs Abs: 3.1 10*3/uL (ref 0.7–4.0)
MCH: 31.8 pg (ref 26.0–34.0)
MCHC: 33.6 g/dL (ref 30.0–36.0)
MCV: 94.6 fL (ref 80.0–100.0)
Monocytes Absolute: 0.5 10*3/uL (ref 0.1–1.0)
Monocytes Relative: 4 %
Neutro Abs: 6.7 10*3/uL (ref 1.7–7.7)
Neutrophils Relative %: 61 %
Platelets: 304 10*3/uL (ref 150–400)
RBC: 4.46 MIL/uL (ref 4.22–5.81)
RDW: 14.3 % (ref 11.5–15.5)
WBC: 10.9 10*3/uL — ABNORMAL HIGH (ref 4.0–10.5)
nRBC: 0 % (ref 0.0–0.2)

## 2021-01-13 MED ORDER — HYDRALAZINE HCL 20 MG/ML IJ SOLN
5.0000 mg | Freq: Once | INTRAMUSCULAR | Status: AC
  Administered 2021-01-13: 5 mg via INTRAVENOUS
  Filled 2021-01-13: qty 1

## 2021-01-13 MED ORDER — LISINOPRIL 10 MG PO TABS
20.0000 mg | ORAL_TABLET | Freq: Once | ORAL | Status: AC
  Administered 2021-01-13: 20 mg via ORAL
  Filled 2021-01-13: qty 2

## 2021-01-13 MED ORDER — LISINOPRIL 20 MG PO TABS: 20.0000 mg | ORAL_TABLET | Freq: Every day | ORAL | 0 refills | Status: DC

## 2021-01-13 NOTE — ED Triage Notes (Signed)
Pt just got put on bp meds and states his bp is higher than it was before he started taking the meds.  Pt states it feels like his heart is skipping a beat sometime.

## 2021-01-13 NOTE — ED Triage Notes (Signed)
Pt to the ED with hypertension after being prescribed BP meds February 17th.  Pt states he feels like his heart is skipping a beat.

## 2021-01-13 NOTE — Discharge Instructions (Addendum)
Follow-up with Dr. Wyline Mood for your blood pressure and the changes on your EKG.  Call tomorrow make an appointment for 1 to 2 weeks
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
## 2021-01-13 NOTE — ED Notes (Signed)
Patient is being discharged from the Urgent Care and sent to the Emergency Department via pov . Per B. Wurst, patient is in need of higher level of care due to heart palpitations . Patient is aware and verbalizes understanding of plan of care. There were no vitals filed for this visit.

## 2021-04-29 ENCOUNTER — Encounter (HOSPITAL_COMMUNITY): Payer: Self-pay

## 2021-04-29 ENCOUNTER — Ambulatory Visit (HOSPITAL_COMMUNITY)
Admission: EM | Admit: 2021-04-29 | Discharge: 2021-04-29 | Disposition: A | Discharge status: Home / Self Care | Payer: No Typology Code available for payment source

## 2021-04-29 ENCOUNTER — Other Ambulatory Visit: Payer: Self-pay

## 2021-04-29 DIAGNOSIS — H109 Unspecified conjunctivitis: Secondary | ICD-10-CM | POA: Diagnosis not present

## 2021-04-29 MED ORDER — POLYMYXIN B-TRIMETHOPRIM 10000-0.1 UNIT/ML-% OP SOLN: 1.0000 [drp] | OPHTHALMIC | 0 refills | Status: DC

## 2021-04-29 NOTE — Discharge Instructions (Addendum)
Use 1 drop in right eye every 6 hours for the next 5 days, if symptoms begin in left eye may use eye drops as well, ensure tip of dropper does not touch eyes  Can use benadryl 25 mg every 6 hours for itching, be mindful this may make you drowsy,   Try not to rub eyes this will cause irritation, can place cool cloth over eye for comfort  If no improvement seen  or symptoms worsen at 48 hours, please follow up for reevaluation

## 2021-04-29 NOTE — ED Triage Notes (Signed)
Patient c/o right eye swelling, soreness (when blinking). There is redness to skin under eye. No interventions taken by patient. Pt states he is able to see out of eye, no drainage from eye noted. 

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
## 2021-10-18 ENCOUNTER — Other Ambulatory Visit: Payer: Self-pay

## 2021-10-18 ENCOUNTER — Ambulatory Visit
Admission: RE | Admit: 2021-10-18 | Discharge: 2021-10-18 | Disposition: A | Discharge status: Home / Self Care | Payer: BC Managed Care – PPO | Source: Ambulatory Visit | Attending: Family Medicine | Admitting: Family Medicine

## 2021-10-18 VITALS — BP 190/98 | HR 54 | Temp 99.4°F | Resp 22

## 2021-10-18 DIAGNOSIS — Z20828 Contact with and (suspected) exposure to other viral communicable diseases: Secondary | ICD-10-CM | POA: Diagnosis not present

## 2021-10-18 DIAGNOSIS — J4521 Mild intermittent asthma with (acute) exacerbation: Secondary | ICD-10-CM

## 2021-10-18 DIAGNOSIS — J069 Acute upper respiratory infection, unspecified: Secondary | ICD-10-CM

## 2021-10-18 MED ORDER — DEXAMETHASONE SODIUM PHOSPHATE 10 MG/ML IJ SOLN
10.0000 mg | Freq: Once | INTRAMUSCULAR | Status: AC
  Administered 2021-10-18: 10 mg via INTRAMUSCULAR

## 2021-10-18 MED ORDER — AZITHROMYCIN 250 MG PO TABS: ORAL_TABLET | ORAL | 0 refills | Status: DC

## 2021-10-18 MED ORDER — PROMETHAZINE-DM 6.25-15 MG/5ML PO SYRP: 5.0000 mL | ORAL_SOLUTION | Freq: Four times a day (QID) | ORAL | 0 refills | Status: DC | PRN

## 2021-10-18 MED ORDER — ALBUTEROL SULFATE HFA 108 (90 BASE) MCG/ACT IN AERS
2.0000 | INHALATION_SPRAY | Freq: Once | RESPIRATORY_TRACT | Status: AC
  Administered 2021-10-18: 2 via RESPIRATORY_TRACT
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
## 2021-10-18 NOTE — ED Triage Notes (Signed)
Pt presents with nasal congestion , cough and body aches that began Friday night, negative covid test

## 2021-10-19 LAB — COVID-19, FLU A+B NAA
Influenza A, NAA: NOT DETECTED
Influenza B, NAA: NOT DETECTED
SARS-CoV-2, NAA: NOT DETECTED

## 2022-03-09 DIAGNOSIS — R634 Abnormal weight loss: Secondary | ICD-10-CM | POA: Diagnosis not present

## 2022-03-09 DIAGNOSIS — R112 Nausea with vomiting, unspecified: Secondary | ICD-10-CM | POA: Diagnosis not present

## 2022-05-17 DIAGNOSIS — R634 Abnormal weight loss: Secondary | ICD-10-CM | POA: Diagnosis not present

## 2022-05-17 DIAGNOSIS — R1110 Vomiting, unspecified: Secondary | ICD-10-CM | POA: Diagnosis not present

## 2022-05-17 DIAGNOSIS — R7303 Prediabetes: Secondary | ICD-10-CM | POA: Diagnosis not present

## 2022-06-16 DIAGNOSIS — A048 Other specified bacterial intestinal infections: Secondary | ICD-10-CM | POA: Diagnosis not present

## 2022-06-16 DIAGNOSIS — E876 Hypokalemia: Secondary | ICD-10-CM | POA: Diagnosis not present

## 2023-08-06 ENCOUNTER — Observation Stay (HOSPITAL_COMMUNITY)
Admission: EM | Admit: 2023-08-06 | Discharge: 2023-08-08 | Disposition: A | Discharge status: Home / Self Care | Payer: 59 | Attending: Internal Medicine | Admitting: Internal Medicine

## 2023-08-06 ENCOUNTER — Other Ambulatory Visit: Payer: Self-pay

## 2023-08-06 ENCOUNTER — Emergency Department (HOSPITAL_COMMUNITY): Payer: 59

## 2023-08-06 DIAGNOSIS — K5289 Other specified noninfective gastroenteritis and colitis: Secondary | ICD-10-CM | POA: Diagnosis not present

## 2023-08-06 DIAGNOSIS — Z23 Encounter for immunization: Secondary | ICD-10-CM | POA: Diagnosis not present

## 2023-08-06 DIAGNOSIS — K529 Noninfective gastroenteritis and colitis, unspecified: Secondary | ICD-10-CM

## 2023-08-06 DIAGNOSIS — I160 Hypertensive urgency: Secondary | ICD-10-CM | POA: Diagnosis not present

## 2023-08-06 DIAGNOSIS — F17210 Nicotine dependence, cigarettes, uncomplicated: Secondary | ICD-10-CM | POA: Insufficient documentation

## 2023-08-06 DIAGNOSIS — J45909 Unspecified asthma, uncomplicated: Secondary | ICD-10-CM | POA: Diagnosis not present

## 2023-08-06 DIAGNOSIS — Z79899 Other long term (current) drug therapy: Secondary | ICD-10-CM | POA: Insufficient documentation

## 2023-08-06 DIAGNOSIS — F1290 Cannabis use, unspecified, uncomplicated: Secondary | ICD-10-CM

## 2023-08-06 DIAGNOSIS — R112 Nausea with vomiting, unspecified: Principal | ICD-10-CM | POA: Insufficient documentation

## 2023-08-06 DIAGNOSIS — Z720 Tobacco use: Secondary | ICD-10-CM

## 2023-08-06 DIAGNOSIS — I10 Essential (primary) hypertension: Secondary | ICD-10-CM | POA: Insufficient documentation

## 2023-08-06 DIAGNOSIS — E669 Obesity, unspecified: Secondary | ICD-10-CM | POA: Insufficient documentation

## 2023-08-06 DIAGNOSIS — R001 Bradycardia, unspecified: Secondary | ICD-10-CM

## 2023-08-06 LAB — COMPREHENSIVE METABOLIC PANEL
ALT: 10 U/L (ref 0–44)
AST: 14 U/L — ABNORMAL LOW (ref 15–41)
Albumin: 4.2 g/dL (ref 3.5–5.0)
Alkaline Phosphatase: 73 U/L (ref 38–126)
Anion gap: 16 — ABNORMAL HIGH (ref 5–15)
BUN: 11 mg/dL (ref 6–20)
CO2: 22 mmol/L (ref 22–32)
Calcium: 9.8 mg/dL (ref 8.9–10.3)
Chloride: 101 mmol/L (ref 98–111)
Creatinine, Ser: 1.15 mg/dL (ref 0.61–1.24)
GFR, Estimated: >60 mL/min (ref >60)
Glucose, Bld: 161 mg/dL — ABNORMAL HIGH (ref 70–99)
Potassium: 3.5 mmol/L (ref 3.5–5.1)
Sodium: 139 mmol/L (ref 135–145)
Total Bilirubin: 0.8 mg/dL (ref 0.3–1.2)
Total Protein: 7.6 g/dL (ref 6.5–8.1)

## 2023-08-06 LAB — CBC WITH DIFFERENTIAL/PLATELET
Abs Immature Granulocytes: 0.07 10*3/uL (ref 0.00–0.07)
Basophils Absolute: 0 10*3/uL (ref 0.0–0.1)
Basophils Relative: 0 %
Eosinophils Absolute: 0 10*3/uL (ref 0.0–0.5)
Eosinophils Relative: 0 %
HCT: 45.9 % (ref 39.0–52.0)
Hemoglobin: 16.1 g/dL (ref 13.0–17.0)
Immature Granulocytes: 1 %
Lymphocytes Relative: 4 %
Lymphs Abs: 0.6 10*3/uL — ABNORMAL LOW (ref 0.7–4.0)
MCH: 32.7 pg (ref 26.0–34.0)
MCHC: 35.1 g/dL (ref 30.0–36.0)
MCV: 93.1 fL (ref 80.0–100.0)
Monocytes Absolute: 0.2 10*3/uL (ref 0.1–1.0)
Monocytes Relative: 2 %
Neutro Abs: 14 10*3/uL — ABNORMAL HIGH (ref 1.7–7.7)
Neutrophils Relative %: 93 %
Platelets: 248 10*3/uL (ref 150–400)
RBC: 4.93 MIL/uL (ref 4.22–5.81)
RDW: 14.1 % (ref 11.5–15.5)
WBC: 14.9 10*3/uL — ABNORMAL HIGH (ref 4.0–10.5)
nRBC: 0 % (ref 0.0–0.2)

## 2023-08-06 LAB — LIPASE, BLOOD: Lipase: 20 U/L (ref 11–51)

## 2023-08-06 MED ORDER — DIPHENHYDRAMINE HCL 50 MG/ML IJ SOLN
25.0000 mg | Freq: Once | INTRAMUSCULAR | Status: AC
  Administered 2023-08-06: 25 mg via INTRAVENOUS
  Filled 2023-08-06: qty 1

## 2023-08-06 MED ORDER — LISINOPRIL 20 MG PO TABS
20.0000 mg | ORAL_TABLET | Freq: Once | ORAL | Status: AC
  Administered 2023-08-06: 20 mg via ORAL
  Filled 2023-08-06: qty 1

## 2023-08-06 MED ORDER — IOHEXOL 350 MG/ML SOLN
75.0000 mL | Freq: Once | INTRAVENOUS | Status: AC | PRN
  Administered 2023-08-06: 75 mL via INTRAVENOUS

## 2023-08-06 MED ORDER — HYDRALAZINE HCL 25 MG PO TABS
25.0000 mg | ORAL_TABLET | Freq: Once | ORAL | Status: AC
  Administered 2023-08-06: 25 mg via ORAL
  Filled 2023-08-06: qty 1

## 2023-08-06 MED ORDER — MORPHINE SULFATE (PF) 4 MG/ML IV SOLN
4.0000 mg | Freq: Once | INTRAVENOUS | Status: AC
  Administered 2023-08-06: 4 mg via INTRAVENOUS
  Filled 2023-08-06: qty 1

## 2023-08-06 MED ORDER — ONDANSETRON HCL 4 MG/2ML IJ SOLN
4.0000 mg | Freq: Once | INTRAMUSCULAR | Status: AC
  Administered 2023-08-06: 4 mg via INTRAVENOUS
  Filled 2023-08-06: qty 2

## 2023-08-06 MED ORDER — DROPERIDOL 2.5 MG/ML IJ SOLN
1.2500 mg | Freq: Once | INTRAMUSCULAR | Status: AC
  Administered 2023-08-06: 1.25 mg via INTRAVENOUS
  Filled 2023-08-06: qty 2

## 2023-08-06 NOTE — ED Notes (Signed)
Patient transported to CT 

## 2023-08-06 NOTE — ED Notes (Signed)
ED Provider back at bedside. 

## 2023-08-06 NOTE — ED Triage Notes (Signed)
Patient reports hypogastric pain for 3 days with emesis and diarrhea , hypertensive at triage ( has not taken his antihypertensive medications ) / HR=50's . PA / charge nurse notified .
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
## 2023-08-06 NOTE — ED Notes (Signed)
ED Provider at bedside. 

## 2023-08-06 NOTE — ED Notes (Signed)
Per provider pt has since vomited again and is complaining of more nausea and passing PO challenge with me. Informed me he will admit pt.

## 2023-08-06 NOTE — ED Notes (Signed)
Pt returned back to room from CT

## 2023-08-07 ENCOUNTER — Encounter (HOSPITAL_COMMUNITY): Payer: Self-pay | Admitting: Internal Medicine

## 2023-08-07 DIAGNOSIS — R112 Nausea with vomiting, unspecified: Secondary | ICD-10-CM | POA: Diagnosis not present

## 2023-08-07 DIAGNOSIS — K529 Noninfective gastroenteritis and colitis, unspecified: Secondary | ICD-10-CM

## 2023-08-07 DIAGNOSIS — R001 Bradycardia, unspecified: Secondary | ICD-10-CM

## 2023-08-07 DIAGNOSIS — F1290 Cannabis use, unspecified, uncomplicated: Secondary | ICD-10-CM

## 2023-08-07 DIAGNOSIS — Z720 Tobacco use: Secondary | ICD-10-CM

## 2023-08-07 DIAGNOSIS — I10 Essential (primary) hypertension: Secondary | ICD-10-CM

## 2023-08-07 LAB — URINALYSIS, ROUTINE W REFLEX MICROSCOPIC
Bacteria, UA: NONE SEEN
Bilirubin Urine: NEGATIVE
Glucose, UA: NEGATIVE mg/dL
Ketones, ur: 5 mg/dL — AB
Leukocytes,Ua: NEGATIVE
Nitrite: NEGATIVE
Protein, ur: 100 mg/dL — AB
Specific Gravity, Urine: >1.046 — ABNORMAL HIGH (ref 1.005–1.030)
pH: 6 (ref 5.0–8.0)

## 2023-08-07 LAB — BASIC METABOLIC PANEL
Anion gap: 15 (ref 5–15)
BUN: 11 mg/dL (ref 6–20)
CO2: 22 mmol/L (ref 22–32)
Calcium: 9.4 mg/dL (ref 8.9–10.3)
Chloride: 100 mmol/L (ref 98–111)
Creatinine, Ser: 1.03 mg/dL (ref 0.61–1.24)
GFR, Estimated: >60 mL/min (ref >60)
Glucose, Bld: 131 mg/dL — ABNORMAL HIGH (ref 70–99)
Potassium: 3.2 mmol/L — ABNORMAL LOW (ref 3.5–5.1)
Sodium: 137 mmol/L (ref 135–145)

## 2023-08-07 LAB — CBC
HCT: 43.5 % (ref 39.0–52.0)
Hemoglobin: 15.4 g/dL (ref 13.0–17.0)
MCH: 32.6 pg (ref 26.0–34.0)
MCHC: 35.4 g/dL (ref 30.0–36.0)
MCV: 92 fL (ref 80.0–100.0)
Platelets: 228 10*3/uL (ref 150–400)
RBC: 4.73 MIL/uL (ref 4.22–5.81)
RDW: 14.2 % (ref 11.5–15.5)
WBC: 15.4 10*3/uL — ABNORMAL HIGH (ref 4.0–10.5)
nRBC: 0 % (ref 0.0–0.2)

## 2023-08-07 LAB — HIV ANTIBODY (ROUTINE TESTING W REFLEX): HIV Screen 4th Generation wRfx: NONREACTIVE

## 2023-08-07 LAB — MAGNESIUM: Magnesium: 2 mg/dL (ref 1.7–2.4)

## 2023-08-07 LAB — TSH: TSH: 0.973 u[IU]/mL (ref 0.350–4.500)

## 2023-08-07 MED ORDER — INFLUENZA VIRUS VACC SPLIT PF (FLUZONE) 0.5 ML IM SUSY
0.5000 mL | PREFILLED_SYRINGE | INTRAMUSCULAR | Status: AC
  Administered 2023-08-08: 0.5 mL via INTRAMUSCULAR
  Filled 2023-08-07: qty 0.5

## 2023-08-07 MED ORDER — SODIUM CHLORIDE 0.9% FLUSH
3.0000 mL | Freq: Two times a day (BID) | INTRAVENOUS | Status: DC
  Administered 2023-08-07 (×3): 3 mL via INTRAVENOUS

## 2023-08-07 MED ORDER — LACTATED RINGERS IV SOLN
INTRAVENOUS | Status: AC
  Administered 2023-08-07: 1000 mL via INTRAVENOUS

## 2023-08-07 MED ORDER — POTASSIUM CHLORIDE 20 MEQ PO PACK
40.0000 meq | PACK | Freq: Once | ORAL | Status: AC
  Administered 2023-08-07: 40 meq via ORAL
  Filled 2023-08-07: qty 2

## 2023-08-07 MED ORDER — ACETAMINOPHEN 500 MG PO TABS: 1000.0000 mg | ORAL_TABLET | Freq: Four times a day (QID) | ORAL | Status: DC | PRN

## 2023-08-07 MED ORDER — ENOXAPARIN SODIUM 40 MG/0.4ML IJ SOSY
40.0000 mg | PREFILLED_SYRINGE | INTRAMUSCULAR | Status: DC
  Administered 2023-08-07: 40 mg via SUBCUTANEOUS
  Filled 2023-08-07: qty 0.4

## 2023-08-07 MED ORDER — ACETAMINOPHEN 650 MG RE SUPP: 650.0000 mg | Freq: Four times a day (QID) | RECTAL | Status: DC | PRN

## 2023-08-07 MED ORDER — METOCLOPRAMIDE HCL 5 MG/ML IJ SOLN
10.0000 mg | Freq: Four times a day (QID) | INTRAMUSCULAR | Status: DC
  Administered 2023-08-07 – 2023-08-08 (×4): 10 mg via INTRAVENOUS
  Filled 2023-08-07 (×4): qty 2

## 2023-08-07 MED ORDER — PNEUMOCOCCAL 20-VAL CONJ VACC 0.5 ML IM SUSY
0.5000 mL | PREFILLED_SYRINGE | INTRAMUSCULAR | Status: AC
  Administered 2023-08-08: 0.5 mL via INTRAMUSCULAR
  Filled 2023-08-07 (×2): qty 0.5

## 2023-08-07 MED ORDER — METOCLOPRAMIDE HCL 5 MG/ML IJ SOLN
10.0000 mg | Freq: Once | INTRAMUSCULAR | Status: AC
  Administered 2023-08-07: 10 mg via INTRAVENOUS
  Filled 2023-08-07: qty 2

## 2023-08-07 MED ORDER — SODIUM CHLORIDE 0.9 % IV SOLN: 12.5000 mg | Freq: Four times a day (QID) | INTRAVENOUS | Status: DC | PRN

## 2023-08-07 MED ORDER — HYDRALAZINE HCL 20 MG/ML IJ SOLN
10.0000 mg | INTRAMUSCULAR | Status: DC | PRN
  Administered 2023-08-07 (×2): 10 mg via INTRAVENOUS
  Filled 2023-08-07 (×2): qty 1

## 2023-08-07 NOTE — Progress Notes (Signed)
   08/07/23 1627  TOC Brief Assessment  Insurance and Status Reviewed ([employer])  Patient has primary care physician Yes ([facility], [facility])  Home environment has been reviewed from home  Prior level of function: independent  Prior/Current Home Services No current home services  Social Determinants of Health Reivew SDOH reviewed needs interventions (Smoking cessation)  Readmission risk has been reviewed Yes (listed as N/A)  Transition of care needs no transition of care needs at this time   [facility] will continue to follow patient for any additional discharge needs
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
## 2023-08-07 NOTE — ED Notes (Signed)
Advised pt we still need a urine sample. Reports he does not feel the urge. Abdomen not distended.

## 2023-08-07 NOTE — ED Notes (Signed)
Pt given urinal and informed we need a urine specimen. Pt voiced understanding and reports he does not have to urinate at this time.
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
## 2023-08-07 NOTE — ED Notes (Signed)
Informed pt we are still needing urine specimen to check for infection. Urinal remains empty at bedside.

## 2023-08-08 ENCOUNTER — Other Ambulatory Visit (HOSPITAL_COMMUNITY): Payer: Self-pay

## 2023-08-08 DIAGNOSIS — I10 Essential (primary) hypertension: Secondary | ICD-10-CM

## 2023-08-08 DIAGNOSIS — R001 Bradycardia, unspecified: Secondary | ICD-10-CM

## 2023-08-08 DIAGNOSIS — K529 Noninfective gastroenteritis and colitis, unspecified: Secondary | ICD-10-CM

## 2023-08-08 DIAGNOSIS — R112 Nausea with vomiting, unspecified: Secondary | ICD-10-CM | POA: Diagnosis not present

## 2023-08-08 LAB — BASIC METABOLIC PANEL
Anion gap: 10 (ref 5–15)
BUN: 13 mg/dL (ref 6–20)
CO2: 24 mmol/L (ref 22–32)
Calcium: 8.9 mg/dL (ref 8.9–10.3)
Chloride: 102 mmol/L (ref 98–111)
Creatinine, Ser: 1.07 mg/dL (ref 0.61–1.24)
GFR, Estimated: >60 mL/min (ref >60)
Glucose, Bld: 92 mg/dL (ref 70–99)
Potassium: 3 mmol/L — ABNORMAL LOW (ref 3.5–5.1)
Sodium: 136 mmol/L (ref 135–145)

## 2023-08-08 LAB — CBC
HCT: 41.3 % (ref 39.0–52.0)
Hemoglobin: 14.3 g/dL (ref 13.0–17.0)
MCH: 31.4 pg (ref 26.0–34.0)
MCHC: 34.6 g/dL (ref 30.0–36.0)
MCV: 90.8 fL (ref 80.0–100.0)
Platelets: 226 10*3/uL (ref 150–400)
RBC: 4.55 MIL/uL (ref 4.22–5.81)
RDW: 14.1 % (ref 11.5–15.5)
WBC: 10.3 10*3/uL (ref 4.0–10.5)
nRBC: 0 % (ref 0.0–0.2)

## 2023-08-08 LAB — MAGNESIUM: Magnesium: 2.1 mg/dL (ref 1.7–2.4)

## 2023-08-08 LAB — PHOSPHORUS: Phosphorus: 2.9 mg/dL (ref 2.5–4.6)

## 2023-08-08 MED ORDER — ONDANSETRON 8 MG PO TBDP
8.0000 mg | ORAL_TABLET | ORAL | 0 refills | Status: AC
  Filled 2023-08-08: qty 10, 2d supply, fill #0

## 2023-08-08 MED ORDER — LISINOPRIL 20 MG PO TABS
20.0000 mg | ORAL_TABLET | Freq: Every day | ORAL | Status: DC
  Administered 2023-08-08: 20 mg via ORAL
  Filled 2023-08-08: qty 1

## 2023-08-08 MED ORDER — LISINOPRIL 20 MG PO TABS
20.0000 mg | ORAL_TABLET | Freq: Every day | ORAL | 1 refills | Status: AC
  Filled 2023-08-08: qty 30, 30d supply, fill #0

## 2023-08-08 MED ORDER — POTASSIUM CHLORIDE CRYS ER 20 MEQ PO TBCR
40.0000 meq | EXTENDED_RELEASE_TABLET | Freq: Once | ORAL | Status: AC
  Administered 2023-08-08: 40 meq via ORAL
  Filled 2023-08-08: qty 2

## 2023-08-08 NOTE — Plan of Care (Signed)

## 2023-08-08 NOTE — TOC Transition Note (Signed)
Transition of Care [facility]) - CM/SW Discharge Note   Patient Details  Name: Douglas Harrison MRN: [id number] Date of Birth: November 14, 1970  Transition of [facility]) CM/SW Contact:  Gordy Clement, RN Phone Number: 08/08/2023, 9:53 AM   Clinical Narrative:    Patient to DC to home today  No TOC needs identified. Family to transport         Patient Goals and CMS Choice      Discharge Placement                         Discharge Plan and Services Additional resources added to the After Visit Summary for                                       Social Determinants of Health (SDOH) Interventions SDOH Screenings   Food Insecurity: No Food Insecurity (08/07/2023)  Housing: Low Risk  (08/07/2023)  Transportation Needs: No Transportation Needs (08/07/2023)  Utilities: Not At Risk (08/07/2023)  Tobacco Use: High Risk (08/07/2023)     Readmission Risk Interventions     No data to display
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
## 2023-08-10 ENCOUNTER — Emergency Department (HOSPITAL_COMMUNITY): Payer: 59

## 2023-08-10 ENCOUNTER — Other Ambulatory Visit: Payer: Self-pay

## 2023-08-10 ENCOUNTER — Emergency Department (HOSPITAL_COMMUNITY)
Admission: EM | Admit: 2023-08-10 | Discharge: 2023-08-10 | Disposition: A | Discharge status: Home / Self Care | Payer: 59 | Attending: Emergency Medicine | Admitting: Emergency Medicine

## 2023-08-10 DIAGNOSIS — R112 Nausea with vomiting, unspecified: Secondary | ICD-10-CM | POA: Diagnosis present

## 2023-08-10 DIAGNOSIS — R001 Bradycardia, unspecified: Secondary | ICD-10-CM | POA: Diagnosis not present

## 2023-08-10 DIAGNOSIS — I10 Essential (primary) hypertension: Secondary | ICD-10-CM | POA: Insufficient documentation

## 2023-08-10 DIAGNOSIS — Z1152 Encounter for screening for COVID-19: Secondary | ICD-10-CM | POA: Diagnosis not present

## 2023-08-10 DIAGNOSIS — E876 Hypokalemia: Secondary | ICD-10-CM | POA: Diagnosis not present

## 2023-08-10 DIAGNOSIS — Z79899 Other long term (current) drug therapy: Secondary | ICD-10-CM | POA: Diagnosis not present

## 2023-08-10 DIAGNOSIS — Z720 Tobacco use: Secondary | ICD-10-CM | POA: Insufficient documentation

## 2023-08-10 LAB — CBC WITH DIFFERENTIAL/PLATELET
Abs Immature Granulocytes: 0.05 10*3/uL (ref 0.00–0.07)
Basophils Absolute: 0 10*3/uL (ref 0.0–0.1)
Basophils Relative: 0 %
Eosinophils Absolute: 0.1 10*3/uL (ref 0.0–0.5)
Eosinophils Relative: 1 %
HCT: 43.3 % (ref 39.0–52.0)
Hemoglobin: 14.6 g/dL (ref 13.0–17.0)
Immature Granulocytes: 0 %
Lymphocytes Relative: 16 %
Lymphs Abs: 1.8 10*3/uL (ref 0.7–4.0)
MCH: 31.4 pg (ref 26.0–34.0)
MCHC: 33.7 g/dL (ref 30.0–36.0)
MCV: 93.1 fL (ref 80.0–100.0)
Monocytes Absolute: 0.7 10*3/uL (ref 0.1–1.0)
Monocytes Relative: 6 %
Neutro Abs: 8.9 10*3/uL — ABNORMAL HIGH (ref 1.7–7.7)
Neutrophils Relative %: 77 %
Platelets: 234 10*3/uL (ref 150–400)
RBC: 4.65 MIL/uL (ref 4.22–5.81)
RDW: 14 % (ref 11.5–15.5)
WBC: 11.5 10*3/uL — ABNORMAL HIGH (ref 4.0–10.5)
nRBC: 0 % (ref 0.0–0.2)

## 2023-08-10 LAB — URINALYSIS, W/ REFLEX TO CULTURE (INFECTION SUSPECTED)
Bacteria, UA: NONE SEEN
Bilirubin Urine: NEGATIVE
Glucose, UA: NEGATIVE mg/dL
Ketones, ur: NEGATIVE mg/dL
Leukocytes,Ua: NEGATIVE
Nitrite: NEGATIVE
Protein, ur: NEGATIVE mg/dL
Specific Gravity, Urine: 1.01 (ref 1.005–1.030)
pH: 6 (ref 5.0–8.0)

## 2023-08-10 LAB — MAGNESIUM: Magnesium: 2.1 mg/dL (ref 1.7–2.4)

## 2023-08-10 LAB — COMPREHENSIVE METABOLIC PANEL WITH GFR
ALT: 10 U/L (ref 0–44)
AST: 14 U/L — ABNORMAL LOW (ref 15–41)
Albumin: 3.6 g/dL (ref 3.5–5.0)
Alkaline Phosphatase: 64 U/L (ref 38–126)
Anion gap: 12 (ref 5–15)
BUN: 11 mg/dL (ref 6–20)
CO2: 26 mmol/L (ref 22–32)
Calcium: 9.2 mg/dL (ref 8.9–10.3)
Chloride: 100 mmol/L (ref 98–111)
Creatinine, Ser: 1.2 mg/dL (ref 0.61–1.24)
GFR, Estimated: >60 mL/min
Glucose, Bld: 109 mg/dL — ABNORMAL HIGH (ref 70–99)
Potassium: 3 mmol/L — ABNORMAL LOW (ref 3.5–5.1)
Sodium: 138 mmol/L (ref 135–145)
Total Bilirubin: 0.5 mg/dL (ref 0.3–1.2)
Total Protein: 6.8 g/dL (ref 6.5–8.1)

## 2023-08-10 LAB — I-STAT CG4 LACTIC ACID, ED
Lactic Acid, Venous: 2.3 mmol/L (ref 0.5–1.9)
Lactic Acid, Venous: 0.8 mmol/L (ref 0.5–1.9)

## 2023-08-10 LAB — SARS CORONAVIRUS 2 BY RT PCR: SARS Coronavirus 2 by RT PCR: NEGATIVE

## 2023-08-10 MED ORDER — POTASSIUM CHLORIDE CRYS ER 20 MEQ PO TBCR
40.0000 meq | EXTENDED_RELEASE_TABLET | Freq: Once | ORAL | Status: AC
  Administered 2023-08-10: 40 meq via ORAL
  Filled 2023-08-10: qty 2

## 2023-08-10 MED ORDER — SODIUM CHLORIDE 0.9 % IV BOLUS
1000.0000 mL | Freq: Once | INTRAVENOUS | Status: AC
  Administered 2023-08-10: 1000 mL via INTRAVENOUS

## 2023-08-10 MED ORDER — POTASSIUM CHLORIDE CRYS ER 20 MEQ PO TBCR
20.0000 meq | EXTENDED_RELEASE_TABLET | Freq: Every day | ORAL | 0 refills | Status: AC
Start: 2023-08-10 — End: ?

## 2023-08-10 MED ORDER — PROMETHAZINE HCL 25 MG PO TABS
25.0000 mg | ORAL_TABLET | Freq: Four times a day (QID) | ORAL | 0 refills | Status: AC | PRN
Start: 2023-08-10 — End: ?

## 2023-08-10 MED ORDER — SODIUM CHLORIDE 0.9 % IV SOLN
12.5000 mg | Freq: Once | INTRAVENOUS | Status: AC
  Administered 2023-08-10: 12.5 mg via INTRAVENOUS
  Filled 2023-08-10: qty 12.5
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
## 2023-08-10 NOTE — ED Triage Notes (Signed)
Pt. Stated, I was just discharged from hospital on Tuesday with the same N/V with chills. I was given Zofran but that made me sick too.

## 2023-08-10 NOTE — Discharge Instructions (Addendum)
You have been evaluated for your symptoms.  Fortunately blood work overall reassuring.  Your potassium level is low bit low, please take potassium supplementation and follow-up with your doctor for recheck.  Please note that your heart rate is slow.  I discussed this with your doctor.  Take Phenergan as needed for nausea.  Return to the ER if you have any concern.

## 2023-08-11 LAB — PATHOLOGIST SMEAR REVIEW

## 2023-08-28 ENCOUNTER — Emergency Department (HOSPITAL_COMMUNITY)
Admission: EM | Admit: 2023-08-28 | Discharge: 2023-08-28 | Discharge status: Against Medical Advice | Payer: Medicaid Other

## 2023-08-28 NOTE — ED Notes (Signed)
Called for pt from lobby. No answer.

## 2023-08-28 NOTE — ED Notes (Signed)
This RN called for pt for triage. No answer.
